# Patient Record
Sex: Male | Born: 1980
Health system: Southern US, Community
[De-identification: ages and names within clinical notes are randomized; demographics above are authoritative.]

## PROBLEM LIST (undated history)

## (undated) DIAGNOSIS — I10 Essential (primary) hypertension: Secondary | ICD-10-CM

---

## 2015-08-24 DIAGNOSIS — M791 Myalgia: Secondary | ICD-10-CM | POA: Diagnosis not present

## 2015-08-24 DIAGNOSIS — M9902 Segmental and somatic dysfunction of thoracic region: Secondary | ICD-10-CM | POA: Diagnosis not present

## 2015-08-24 DIAGNOSIS — M9903 Segmental and somatic dysfunction of lumbar region: Secondary | ICD-10-CM | POA: Diagnosis not present

## 2015-08-24 DIAGNOSIS — M9901 Segmental and somatic dysfunction of cervical region: Secondary | ICD-10-CM | POA: Diagnosis not present

## 2015-11-30 DIAGNOSIS — K080 Exfoliation of teeth due to systemic causes: Secondary | ICD-10-CM | POA: Diagnosis not present

## 2016-02-05 DIAGNOSIS — G47419 Narcolepsy without cataplexy: Secondary | ICD-10-CM | POA: Diagnosis not present

## 2016-05-09 DIAGNOSIS — M9902 Segmental and somatic dysfunction of thoracic region: Secondary | ICD-10-CM | POA: Diagnosis not present

## 2016-05-09 DIAGNOSIS — M9903 Segmental and somatic dysfunction of lumbar region: Secondary | ICD-10-CM | POA: Diagnosis not present

## 2016-05-09 DIAGNOSIS — M9905 Segmental and somatic dysfunction of pelvic region: Secondary | ICD-10-CM | POA: Diagnosis not present

## 2016-05-09 DIAGNOSIS — M9901 Segmental and somatic dysfunction of cervical region: Secondary | ICD-10-CM | POA: Diagnosis not present

## 2016-05-10 DIAGNOSIS — J069 Acute upper respiratory infection, unspecified: Secondary | ICD-10-CM | POA: Diagnosis not present

## 2016-05-19 DIAGNOSIS — Z0001 Encounter for general adult medical examination with abnormal findings: Secondary | ICD-10-CM | POA: Diagnosis not present

## 2016-05-20 DIAGNOSIS — Z0001 Encounter for general adult medical examination with abnormal findings: Secondary | ICD-10-CM | POA: Diagnosis not present

## 2016-07-08 DIAGNOSIS — J029 Acute pharyngitis, unspecified: Secondary | ICD-10-CM | POA: Diagnosis not present

## 2016-07-08 DIAGNOSIS — R05 Cough: Secondary | ICD-10-CM | POA: Diagnosis not present

## 2016-07-08 DIAGNOSIS — J209 Acute bronchitis, unspecified: Secondary | ICD-10-CM | POA: Diagnosis not present

## 2016-09-23 DIAGNOSIS — G47419 Narcolepsy without cataplexy: Secondary | ICD-10-CM | POA: Diagnosis not present

## 2016-11-21 DIAGNOSIS — M791 Myalgia: Secondary | ICD-10-CM | POA: Diagnosis not present

## 2016-11-21 DIAGNOSIS — M9903 Segmental and somatic dysfunction of lumbar region: Secondary | ICD-10-CM | POA: Diagnosis not present

## 2016-11-21 DIAGNOSIS — M9905 Segmental and somatic dysfunction of pelvic region: Secondary | ICD-10-CM | POA: Diagnosis not present

## 2016-11-21 DIAGNOSIS — M9902 Segmental and somatic dysfunction of thoracic region: Secondary | ICD-10-CM | POA: Diagnosis not present

## 2016-11-24 DIAGNOSIS — M9903 Segmental and somatic dysfunction of lumbar region: Secondary | ICD-10-CM | POA: Diagnosis not present

## 2016-11-24 DIAGNOSIS — M9902 Segmental and somatic dysfunction of thoracic region: Secondary | ICD-10-CM | POA: Diagnosis not present

## 2016-11-24 DIAGNOSIS — M791 Myalgia: Secondary | ICD-10-CM | POA: Diagnosis not present

## 2016-11-24 DIAGNOSIS — M9905 Segmental and somatic dysfunction of pelvic region: Secondary | ICD-10-CM | POA: Diagnosis not present

## 2016-11-28 DIAGNOSIS — M9905 Segmental and somatic dysfunction of pelvic region: Secondary | ICD-10-CM | POA: Diagnosis not present

## 2016-11-28 DIAGNOSIS — M9902 Segmental and somatic dysfunction of thoracic region: Secondary | ICD-10-CM | POA: Diagnosis not present

## 2016-11-28 DIAGNOSIS — M791 Myalgia: Secondary | ICD-10-CM | POA: Diagnosis not present

## 2016-11-28 DIAGNOSIS — M9903 Segmental and somatic dysfunction of lumbar region: Secondary | ICD-10-CM | POA: Diagnosis not present

## 2016-12-12 DIAGNOSIS — M9905 Segmental and somatic dysfunction of pelvic region: Secondary | ICD-10-CM | POA: Diagnosis not present

## 2016-12-12 DIAGNOSIS — M9903 Segmental and somatic dysfunction of lumbar region: Secondary | ICD-10-CM | POA: Diagnosis not present

## 2016-12-12 DIAGNOSIS — M791 Myalgia: Secondary | ICD-10-CM | POA: Diagnosis not present

## 2016-12-12 DIAGNOSIS — M9902 Segmental and somatic dysfunction of thoracic region: Secondary | ICD-10-CM | POA: Diagnosis not present

## 2017-01-06 DIAGNOSIS — M9902 Segmental and somatic dysfunction of thoracic region: Secondary | ICD-10-CM | POA: Diagnosis not present

## 2017-01-06 DIAGNOSIS — M9903 Segmental and somatic dysfunction of lumbar region: Secondary | ICD-10-CM | POA: Diagnosis not present

## 2017-01-06 DIAGNOSIS — M791 Myalgia: Secondary | ICD-10-CM | POA: Diagnosis not present

## 2017-01-06 DIAGNOSIS — M9905 Segmental and somatic dysfunction of pelvic region: Secondary | ICD-10-CM | POA: Diagnosis not present

## 2017-04-08 DIAGNOSIS — G47419 Narcolepsy without cataplexy: Secondary | ICD-10-CM | POA: Diagnosis not present

## 2017-06-01 DIAGNOSIS — J029 Acute pharyngitis, unspecified: Secondary | ICD-10-CM | POA: Diagnosis not present

## 2017-06-03 DIAGNOSIS — J029 Acute pharyngitis, unspecified: Secondary | ICD-10-CM | POA: Diagnosis not present

## 2017-06-03 DIAGNOSIS — J020 Streptococcal pharyngitis: Secondary | ICD-10-CM | POA: Diagnosis not present

## 2017-06-03 DIAGNOSIS — J0300 Acute streptococcal tonsillitis, unspecified: Secondary | ICD-10-CM | POA: Diagnosis not present

## 2017-07-03 ENCOUNTER — Ambulatory Visit
Admission: RE | Admit: 2017-07-03 | Discharge: 2017-07-03 | Disposition: A | Discharge status: Home / Self Care | Payer: Federal, State, Local not specified - PPO | Source: Ambulatory Visit | Attending: Internal Medicine | Admitting: Internal Medicine

## 2017-07-03 ENCOUNTER — Other Ambulatory Visit: Payer: Self-pay | Admitting: Internal Medicine

## 2017-07-03 DIAGNOSIS — R05 Cough: Secondary | ICD-10-CM | POA: Diagnosis not present

## 2017-07-03 DIAGNOSIS — R059 Cough, unspecified: Secondary | ICD-10-CM

## 2017-07-03 DIAGNOSIS — G47419 Narcolepsy without cataplexy: Secondary | ICD-10-CM | POA: Diagnosis not present

## 2017-07-21 DIAGNOSIS — M791 Myalgia, unspecified site: Secondary | ICD-10-CM | POA: Diagnosis not present

## 2017-07-21 DIAGNOSIS — M9902 Segmental and somatic dysfunction of thoracic region: Secondary | ICD-10-CM | POA: Diagnosis not present

## 2017-07-21 DIAGNOSIS — M9905 Segmental and somatic dysfunction of pelvic region: Secondary | ICD-10-CM | POA: Diagnosis not present

## 2017-07-21 DIAGNOSIS — M9903 Segmental and somatic dysfunction of lumbar region: Secondary | ICD-10-CM | POA: Diagnosis not present

## 2017-09-28 DIAGNOSIS — K080 Exfoliation of teeth due to systemic causes: Secondary | ICD-10-CM | POA: Diagnosis not present

## 2017-11-04 DIAGNOSIS — M791 Myalgia, unspecified site: Secondary | ICD-10-CM | POA: Diagnosis not present

## 2017-11-04 DIAGNOSIS — M9905 Segmental and somatic dysfunction of pelvic region: Secondary | ICD-10-CM | POA: Diagnosis not present

## 2017-11-04 DIAGNOSIS — M9903 Segmental and somatic dysfunction of lumbar region: Secondary | ICD-10-CM | POA: Diagnosis not present

## 2017-11-04 DIAGNOSIS — M9902 Segmental and somatic dysfunction of thoracic region: Secondary | ICD-10-CM | POA: Diagnosis not present

## 2017-11-12 DIAGNOSIS — G47419 Narcolepsy without cataplexy: Secondary | ICD-10-CM | POA: Diagnosis not present

## 2017-11-13 DIAGNOSIS — M791 Myalgia, unspecified site: Secondary | ICD-10-CM | POA: Diagnosis not present

## 2017-11-13 DIAGNOSIS — M9905 Segmental and somatic dysfunction of pelvic region: Secondary | ICD-10-CM | POA: Diagnosis not present

## 2017-11-13 DIAGNOSIS — M9903 Segmental and somatic dysfunction of lumbar region: Secondary | ICD-10-CM | POA: Diagnosis not present

## 2017-11-13 DIAGNOSIS — M9902 Segmental and somatic dysfunction of thoracic region: Secondary | ICD-10-CM | POA: Diagnosis not present

## 2017-11-28 DIAGNOSIS — K080 Exfoliation of teeth due to systemic causes: Secondary | ICD-10-CM | POA: Diagnosis not present

## 2018-03-03 DIAGNOSIS — M9902 Segmental and somatic dysfunction of thoracic region: Secondary | ICD-10-CM | POA: Diagnosis not present

## 2018-03-03 DIAGNOSIS — K080 Exfoliation of teeth due to systemic causes: Secondary | ICD-10-CM | POA: Diagnosis not present

## 2018-03-03 DIAGNOSIS — M9903 Segmental and somatic dysfunction of lumbar region: Secondary | ICD-10-CM | POA: Diagnosis not present

## 2018-03-03 DIAGNOSIS — M9905 Segmental and somatic dysfunction of pelvic region: Secondary | ICD-10-CM | POA: Diagnosis not present

## 2018-03-03 DIAGNOSIS — M791 Myalgia, unspecified site: Secondary | ICD-10-CM | POA: Diagnosis not present

## 2018-03-15 DIAGNOSIS — M791 Myalgia, unspecified site: Secondary | ICD-10-CM | POA: Diagnosis not present

## 2018-03-15 DIAGNOSIS — M9902 Segmental and somatic dysfunction of thoracic region: Secondary | ICD-10-CM | POA: Diagnosis not present

## 2018-03-15 DIAGNOSIS — M9903 Segmental and somatic dysfunction of lumbar region: Secondary | ICD-10-CM | POA: Diagnosis not present

## 2018-03-15 DIAGNOSIS — M9905 Segmental and somatic dysfunction of pelvic region: Secondary | ICD-10-CM | POA: Diagnosis not present

## 2018-03-23 DIAGNOSIS — M9903 Segmental and somatic dysfunction of lumbar region: Secondary | ICD-10-CM | POA: Diagnosis not present

## 2018-03-23 DIAGNOSIS — M791 Myalgia, unspecified site: Secondary | ICD-10-CM | POA: Diagnosis not present

## 2018-03-23 DIAGNOSIS — M9905 Segmental and somatic dysfunction of pelvic region: Secondary | ICD-10-CM | POA: Diagnosis not present

## 2018-03-23 DIAGNOSIS — M9902 Segmental and somatic dysfunction of thoracic region: Secondary | ICD-10-CM | POA: Diagnosis not present

## 2018-04-05 DIAGNOSIS — G47419 Narcolepsy without cataplexy: Secondary | ICD-10-CM | POA: Diagnosis not present

## 2018-04-17 DIAGNOSIS — F4323 Adjustment disorder with mixed anxiety and depressed mood: Secondary | ICD-10-CM | POA: Diagnosis not present

## 2018-04-24 DIAGNOSIS — Z202 Contact with and (suspected) exposure to infections with a predominantly sexual mode of transmission: Secondary | ICD-10-CM | POA: Diagnosis not present

## 2018-04-24 DIAGNOSIS — F4323 Adjustment disorder with mixed anxiety and depressed mood: Secondary | ICD-10-CM | POA: Diagnosis not present

## 2018-04-24 DIAGNOSIS — Z630 Problems in relationship with spouse or partner: Secondary | ICD-10-CM | POA: Diagnosis not present

## 2018-05-10 DIAGNOSIS — G47419 Narcolepsy without cataplexy: Secondary | ICD-10-CM | POA: Diagnosis not present

## 2018-05-12 DIAGNOSIS — F4323 Adjustment disorder with mixed anxiety and depressed mood: Secondary | ICD-10-CM | POA: Diagnosis not present

## 2018-05-21 DIAGNOSIS — F4323 Adjustment disorder with mixed anxiety and depressed mood: Secondary | ICD-10-CM | POA: Diagnosis not present

## 2018-05-31 ENCOUNTER — Other Ambulatory Visit (HOSPITAL_BASED_OUTPATIENT_CLINIC_OR_DEPARTMENT_OTHER): Payer: Self-pay

## 2018-05-31 DIAGNOSIS — G4710 Hypersomnia, unspecified: Secondary | ICD-10-CM

## 2018-05-31 DIAGNOSIS — G47429 Narcolepsy in conditions classified elsewhere without cataplexy: Secondary | ICD-10-CM

## 2018-06-03 DIAGNOSIS — F4323 Adjustment disorder with mixed anxiety and depressed mood: Secondary | ICD-10-CM | POA: Diagnosis not present

## 2018-06-04 DIAGNOSIS — M9905 Segmental and somatic dysfunction of pelvic region: Secondary | ICD-10-CM | POA: Diagnosis not present

## 2018-06-04 DIAGNOSIS — M9902 Segmental and somatic dysfunction of thoracic region: Secondary | ICD-10-CM | POA: Diagnosis not present

## 2018-06-04 DIAGNOSIS — M9903 Segmental and somatic dysfunction of lumbar region: Secondary | ICD-10-CM | POA: Diagnosis not present

## 2018-06-04 DIAGNOSIS — M791 Myalgia, unspecified site: Secondary | ICD-10-CM | POA: Diagnosis not present

## 2018-06-12 DIAGNOSIS — F4323 Adjustment disorder with mixed anxiety and depressed mood: Secondary | ICD-10-CM | POA: Diagnosis not present

## 2018-06-18 DIAGNOSIS — F4323 Adjustment disorder with mixed anxiety and depressed mood: Secondary | ICD-10-CM | POA: Diagnosis not present

## 2018-06-18 DIAGNOSIS — Z630 Problems in relationship with spouse or partner: Secondary | ICD-10-CM | POA: Diagnosis not present

## 2018-06-21 ENCOUNTER — Ambulatory Visit (HOSPITAL_BASED_OUTPATIENT_CLINIC_OR_DEPARTMENT_OTHER): Payer: Federal, State, Local not specified - PPO | Attending: Internal Medicine | Admitting: Internal Medicine

## 2018-06-21 DIAGNOSIS — G47429 Narcolepsy in conditions classified elsewhere without cataplexy: Secondary | ICD-10-CM | POA: Diagnosis not present

## 2018-06-21 DIAGNOSIS — G4710 Hypersomnia, unspecified: Secondary | ICD-10-CM | POA: Insufficient documentation

## 2018-06-21 DIAGNOSIS — G47419 Narcolepsy without cataplexy: Secondary | ICD-10-CM | POA: Diagnosis not present

## 2018-06-21 NOTE — Procedures (Signed)
    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 Found asleep in bathroom stall. Medical examiner for [employer] requiring MWT to assure that patient does not have pathologic sleepiness. Patient slept at home the night before the study and took his usual narcolepsy medications at 0600 and 1300  EPWORTH SLEEPINESS SCORE:   HEIGHT: 5\' 10"  (177.8 cm)  WEIGHT: 210 lb (95.3 kg)    Body mass index is 30.13 kg/m.  NECK SIZE: 16.5 in.  MEDICATIONS: Methylphenidate 20 mg at 0600 and 1300 on day of testing.   NAP 1: No sleep. Session terminated after 40 minutes  NAP 2: No sleep. Session terminated after 40 minutes  NAP 3: Sleep onset (3 consecutive epochs of N1 sleep) at 12 minutes  NAP 4: No sleep. Session terminated after 40 minutes  MEAN SLEEP LATENCY: 33 minutes  NUMBER OF REM EPISODES:  none  IMPRESSION/ RECOMMENDATION:  This test is consistent with the findings of NO PATHOLOGICAL SLEEPINESS. The patient should be considered safe for driving and for carrying a service weapon.    Deretha Emory Sleep specialist, American Board of Internal Medicine  ELECTRONICALLY SIGNED ON:  06/21/2018, 8:39 PM [facility] PH: [phone]   FX: [phone] ACCREDITED BY THE AMERICAN ACADEMY OF SLEEP MEDICINE

## 2018-06-28 DIAGNOSIS — F4323 Adjustment disorder with mixed anxiety and depressed mood: Secondary | ICD-10-CM | POA: Diagnosis not present

## 2018-06-28 DIAGNOSIS — Z630 Problems in relationship with spouse or partner: Secondary | ICD-10-CM | POA: Diagnosis not present

## 2018-07-03 DIAGNOSIS — F4322 Adjustment disorder with anxiety: Secondary | ICD-10-CM | POA: Diagnosis not present

## 2018-07-05 DIAGNOSIS — F4323 Adjustment disorder with mixed anxiety and depressed mood: Secondary | ICD-10-CM | POA: Diagnosis not present

## 2018-07-10 DIAGNOSIS — F4322 Adjustment disorder with anxiety: Secondary | ICD-10-CM | POA: Diagnosis not present

## 2018-07-12 DIAGNOSIS — Z630 Problems in relationship with spouse or partner: Secondary | ICD-10-CM | POA: Diagnosis not present

## 2018-07-12 DIAGNOSIS — F4323 Adjustment disorder with mixed anxiety and depressed mood: Secondary | ICD-10-CM | POA: Diagnosis not present

## 2018-07-15 DIAGNOSIS — Z630 Problems in relationship with spouse or partner: Secondary | ICD-10-CM | POA: Diagnosis not present

## 2018-07-15 DIAGNOSIS — F4323 Adjustment disorder with mixed anxiety and depressed mood: Secondary | ICD-10-CM | POA: Diagnosis not present

## 2018-07-22 DIAGNOSIS — F4323 Adjustment disorder with mixed anxiety and depressed mood: Secondary | ICD-10-CM | POA: Diagnosis not present

## 2018-07-22 DIAGNOSIS — Z630 Problems in relationship with spouse or partner: Secondary | ICD-10-CM | POA: Diagnosis not present

## 2018-07-24 DIAGNOSIS — F4322 Adjustment disorder with anxiety: Secondary | ICD-10-CM | POA: Diagnosis not present

## 2018-07-27 DIAGNOSIS — Z630 Problems in relationship with spouse or partner: Secondary | ICD-10-CM | POA: Diagnosis not present

## 2018-07-27 DIAGNOSIS — F4323 Adjustment disorder with mixed anxiety and depressed mood: Secondary | ICD-10-CM | POA: Diagnosis not present

## 2018-08-05 DIAGNOSIS — F4323 Adjustment disorder with mixed anxiety and depressed mood: Secondary | ICD-10-CM | POA: Diagnosis not present

## 2018-08-05 DIAGNOSIS — Z630 Problems in relationship with spouse or partner: Secondary | ICD-10-CM | POA: Diagnosis not present

## 2018-08-12 DIAGNOSIS — F4323 Adjustment disorder with mixed anxiety and depressed mood: Secondary | ICD-10-CM | POA: Diagnosis not present

## 2018-08-12 DIAGNOSIS — Z630 Problems in relationship with spouse or partner: Secondary | ICD-10-CM | POA: Diagnosis not present

## 2018-08-16 DIAGNOSIS — J029 Acute pharyngitis, unspecified: Secondary | ICD-10-CM | POA: Diagnosis not present

## 2018-08-23 DIAGNOSIS — F4323 Adjustment disorder with mixed anxiety and depressed mood: Secondary | ICD-10-CM | POA: Diagnosis not present

## 2018-08-23 DIAGNOSIS — Z630 Problems in relationship with spouse or partner: Secondary | ICD-10-CM | POA: Diagnosis not present

## 2018-08-26 DIAGNOSIS — Z630 Problems in relationship with spouse or partner: Secondary | ICD-10-CM | POA: Diagnosis not present

## 2018-08-26 DIAGNOSIS — F4323 Adjustment disorder with mixed anxiety and depressed mood: Secondary | ICD-10-CM | POA: Diagnosis not present

## 2018-08-30 DIAGNOSIS — Z630 Problems in relationship with spouse or partner: Secondary | ICD-10-CM | POA: Diagnosis not present

## 2018-08-30 DIAGNOSIS — F4323 Adjustment disorder with mixed anxiety and depressed mood: Secondary | ICD-10-CM | POA: Diagnosis not present

## 2018-09-10 DIAGNOSIS — Z630 Problems in relationship with spouse or partner: Secondary | ICD-10-CM | POA: Diagnosis not present

## 2018-09-10 DIAGNOSIS — F4323 Adjustment disorder with mixed anxiety and depressed mood: Secondary | ICD-10-CM | POA: Diagnosis not present

## 2018-09-16 DIAGNOSIS — Z630 Problems in relationship with spouse or partner: Secondary | ICD-10-CM | POA: Diagnosis not present

## 2018-09-16 DIAGNOSIS — F4323 Adjustment disorder with mixed anxiety and depressed mood: Secondary | ICD-10-CM | POA: Diagnosis not present

## 2018-09-23 DIAGNOSIS — Z630 Problems in relationship with spouse or partner: Secondary | ICD-10-CM | POA: Diagnosis not present

## 2018-09-23 DIAGNOSIS — F4323 Adjustment disorder with mixed anxiety and depressed mood: Secondary | ICD-10-CM | POA: Diagnosis not present

## 2018-09-30 DIAGNOSIS — F4323 Adjustment disorder with mixed anxiety and depressed mood: Secondary | ICD-10-CM | POA: Diagnosis not present

## 2018-09-30 DIAGNOSIS — Z630 Problems in relationship with spouse or partner: Secondary | ICD-10-CM | POA: Diagnosis not present

## 2018-10-01 DIAGNOSIS — M9902 Segmental and somatic dysfunction of thoracic region: Secondary | ICD-10-CM | POA: Diagnosis not present

## 2018-10-01 DIAGNOSIS — M791 Myalgia, unspecified site: Secondary | ICD-10-CM | POA: Diagnosis not present

## 2018-10-01 DIAGNOSIS — M9903 Segmental and somatic dysfunction of lumbar region: Secondary | ICD-10-CM | POA: Diagnosis not present

## 2018-10-01 DIAGNOSIS — M9905 Segmental and somatic dysfunction of pelvic region: Secondary | ICD-10-CM | POA: Diagnosis not present

## 2018-10-06 DIAGNOSIS — M9905 Segmental and somatic dysfunction of pelvic region: Secondary | ICD-10-CM | POA: Diagnosis not present

## 2018-10-06 DIAGNOSIS — M9902 Segmental and somatic dysfunction of thoracic region: Secondary | ICD-10-CM | POA: Diagnosis not present

## 2018-10-06 DIAGNOSIS — G47419 Narcolepsy without cataplexy: Secondary | ICD-10-CM | POA: Diagnosis not present

## 2018-10-06 DIAGNOSIS — M9903 Segmental and somatic dysfunction of lumbar region: Secondary | ICD-10-CM | POA: Diagnosis not present

## 2018-10-06 DIAGNOSIS — M791 Myalgia, unspecified site: Secondary | ICD-10-CM | POA: Diagnosis not present

## 2018-10-07 DIAGNOSIS — Z630 Problems in relationship with spouse or partner: Secondary | ICD-10-CM | POA: Diagnosis not present

## 2018-10-07 DIAGNOSIS — F4323 Adjustment disorder with mixed anxiety and depressed mood: Secondary | ICD-10-CM | POA: Diagnosis not present

## 2018-10-14 DIAGNOSIS — F4323 Adjustment disorder with mixed anxiety and depressed mood: Secondary | ICD-10-CM | POA: Diagnosis not present

## 2018-10-14 DIAGNOSIS — Z630 Problems in relationship with spouse or partner: Secondary | ICD-10-CM | POA: Diagnosis not present

## 2018-10-28 DIAGNOSIS — F4323 Adjustment disorder with mixed anxiety and depressed mood: Secondary | ICD-10-CM | POA: Diagnosis not present

## 2018-10-28 DIAGNOSIS — Z630 Problems in relationship with spouse or partner: Secondary | ICD-10-CM | POA: Diagnosis not present

## 2018-10-30 DIAGNOSIS — F4322 Adjustment disorder with anxiety: Secondary | ICD-10-CM | POA: Diagnosis not present

## 2018-11-11 DIAGNOSIS — F4323 Adjustment disorder with mixed anxiety and depressed mood: Secondary | ICD-10-CM | POA: Diagnosis not present

## 2018-11-11 DIAGNOSIS — Z630 Problems in relationship with spouse or partner: Secondary | ICD-10-CM | POA: Diagnosis not present

## 2018-12-17 DIAGNOSIS — F4323 Adjustment disorder with mixed anxiety and depressed mood: Secondary | ICD-10-CM | POA: Diagnosis not present

## 2018-12-17 DIAGNOSIS — Z630 Problems in relationship with spouse or partner: Secondary | ICD-10-CM | POA: Diagnosis not present

## 2019-01-13 DIAGNOSIS — F4323 Adjustment disorder with mixed anxiety and depressed mood: Secondary | ICD-10-CM | POA: Diagnosis not present

## 2019-01-13 DIAGNOSIS — Z630 Problems in relationship with spouse or partner: Secondary | ICD-10-CM | POA: Diagnosis not present

## 2019-01-20 DIAGNOSIS — Z630 Problems in relationship with spouse or partner: Secondary | ICD-10-CM | POA: Diagnosis not present

## 2019-01-20 DIAGNOSIS — F4323 Adjustment disorder with mixed anxiety and depressed mood: Secondary | ICD-10-CM | POA: Diagnosis not present

## 2019-01-24 DIAGNOSIS — Z23 Encounter for immunization: Secondary | ICD-10-CM | POA: Diagnosis not present

## 2019-01-24 DIAGNOSIS — J029 Acute pharyngitis, unspecified: Secondary | ICD-10-CM | POA: Diagnosis not present

## 2019-01-24 DIAGNOSIS — J018 Other acute sinusitis: Secondary | ICD-10-CM | POA: Diagnosis not present

## 2019-01-27 DIAGNOSIS — Z630 Problems in relationship with spouse or partner: Secondary | ICD-10-CM | POA: Diagnosis not present

## 2019-01-27 DIAGNOSIS — F4323 Adjustment disorder with mixed anxiety and depressed mood: Secondary | ICD-10-CM | POA: Diagnosis not present

## 2019-01-31 DIAGNOSIS — F4322 Adjustment disorder with anxiety: Secondary | ICD-10-CM | POA: Diagnosis not present

## 2019-01-31 DIAGNOSIS — G47419 Narcolepsy without cataplexy: Secondary | ICD-10-CM | POA: Diagnosis not present

## 2019-02-10 DIAGNOSIS — Z630 Problems in relationship with spouse or partner: Secondary | ICD-10-CM | POA: Diagnosis not present

## 2019-02-10 DIAGNOSIS — F4323 Adjustment disorder with mixed anxiety and depressed mood: Secondary | ICD-10-CM | POA: Diagnosis not present

## 2019-02-22 DIAGNOSIS — G47419 Narcolepsy without cataplexy: Secondary | ICD-10-CM | POA: Diagnosis not present

## 2019-02-24 DIAGNOSIS — Z630 Problems in relationship with spouse or partner: Secondary | ICD-10-CM | POA: Diagnosis not present

## 2019-02-24 DIAGNOSIS — F4323 Adjustment disorder with mixed anxiety and depressed mood: Secondary | ICD-10-CM | POA: Diagnosis not present

## 2019-03-03 DIAGNOSIS — Z630 Problems in relationship with spouse or partner: Secondary | ICD-10-CM | POA: Diagnosis not present

## 2019-03-03 DIAGNOSIS — F4323 Adjustment disorder with mixed anxiety and depressed mood: Secondary | ICD-10-CM | POA: Diagnosis not present

## 2019-03-10 DIAGNOSIS — F4323 Adjustment disorder with mixed anxiety and depressed mood: Secondary | ICD-10-CM | POA: Diagnosis not present

## 2019-03-10 DIAGNOSIS — Z630 Problems in relationship with spouse or partner: Secondary | ICD-10-CM | POA: Diagnosis not present

## 2019-03-28 DIAGNOSIS — Z630 Problems in relationship with spouse or partner: Secondary | ICD-10-CM | POA: Diagnosis not present

## 2019-03-28 DIAGNOSIS — F4323 Adjustment disorder with mixed anxiety and depressed mood: Secondary | ICD-10-CM | POA: Diagnosis not present

## 2019-04-13 DIAGNOSIS — K080 Exfoliation of teeth due to systemic causes: Secondary | ICD-10-CM | POA: Diagnosis not present

## 2019-04-20 DIAGNOSIS — F411 Generalized anxiety disorder: Secondary | ICD-10-CM | POA: Diagnosis not present

## 2019-04-25 DIAGNOSIS — G47419 Narcolepsy without cataplexy: Secondary | ICD-10-CM | POA: Diagnosis not present

## 2019-04-26 DIAGNOSIS — Z630 Problems in relationship with spouse or partner: Secondary | ICD-10-CM | POA: Diagnosis not present

## 2019-04-26 DIAGNOSIS — F4323 Adjustment disorder with mixed anxiety and depressed mood: Secondary | ICD-10-CM | POA: Diagnosis not present

## 2019-05-04 DIAGNOSIS — Z630 Problems in relationship with spouse or partner: Secondary | ICD-10-CM | POA: Diagnosis not present

## 2019-05-04 DIAGNOSIS — F4323 Adjustment disorder with mixed anxiety and depressed mood: Secondary | ICD-10-CM | POA: Diagnosis not present

## 2019-05-09 DIAGNOSIS — Z03818 Encounter for observation for suspected exposure to other biological agents ruled out: Secondary | ICD-10-CM | POA: Diagnosis not present

## 2019-05-09 IMAGING — DX DG CHEST 2V
3 series · 3 of 3 positions shown · non-contrast
Comparison: None.

CLINICAL DATA: Chronic cough nonsmoker

EXAM:
CHEST  2 VIEW

[dg chest 2 view (1 of 3)]
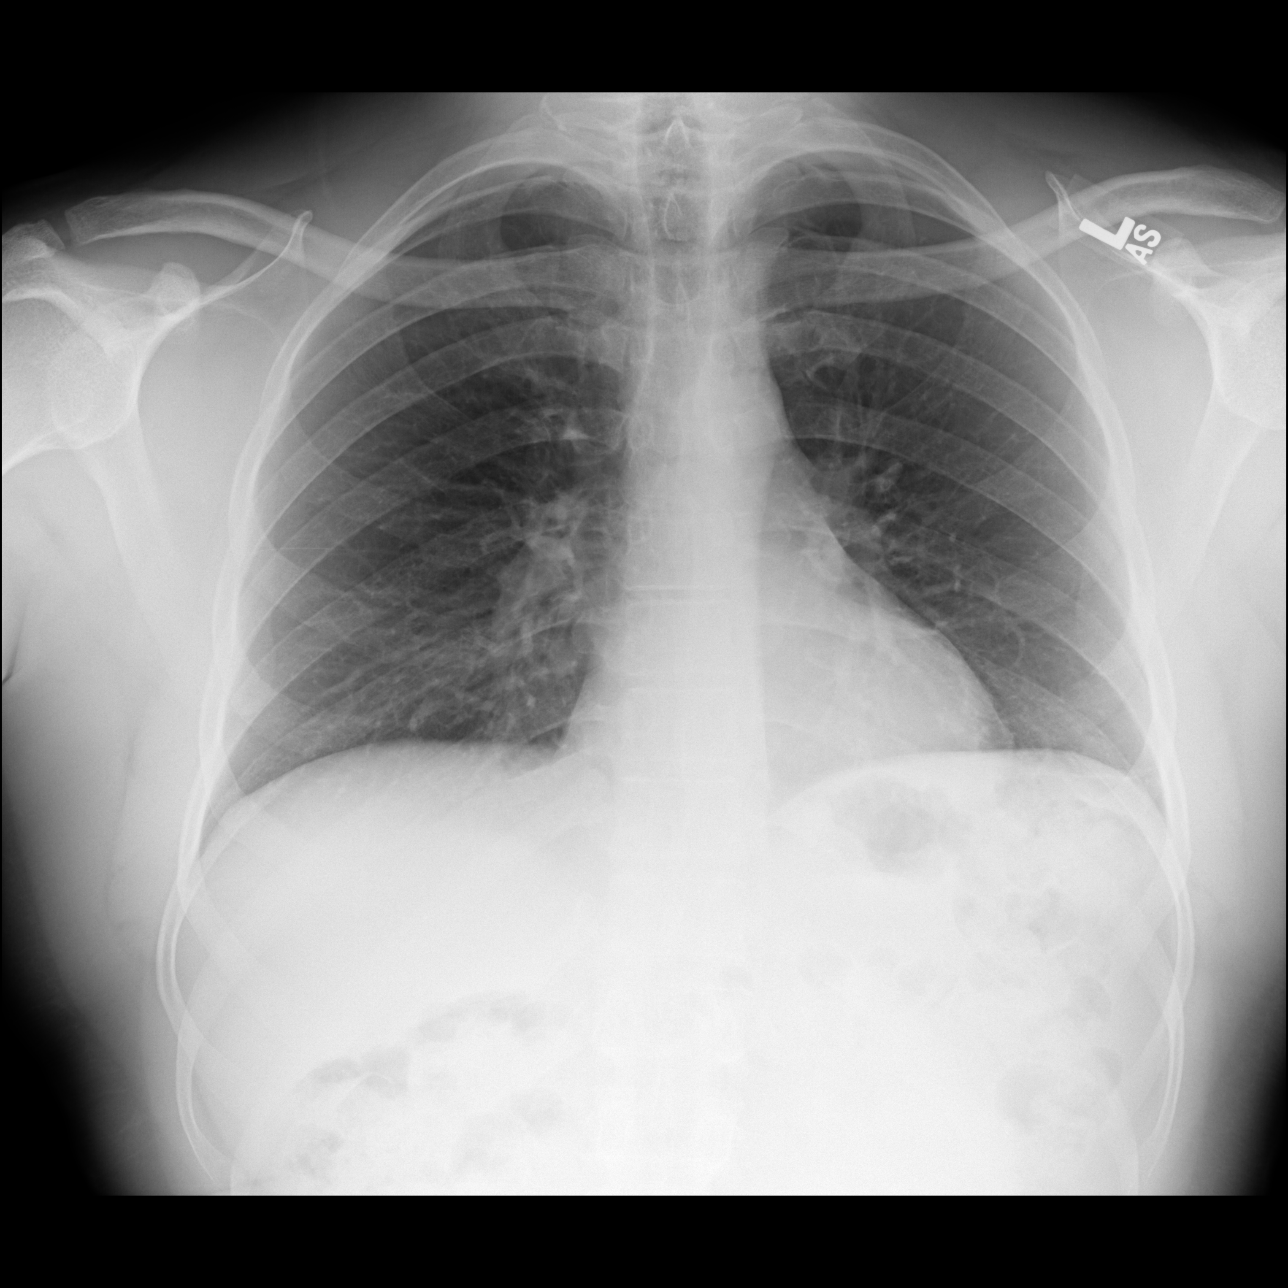

[dg chest 2 view (2 of 3)]
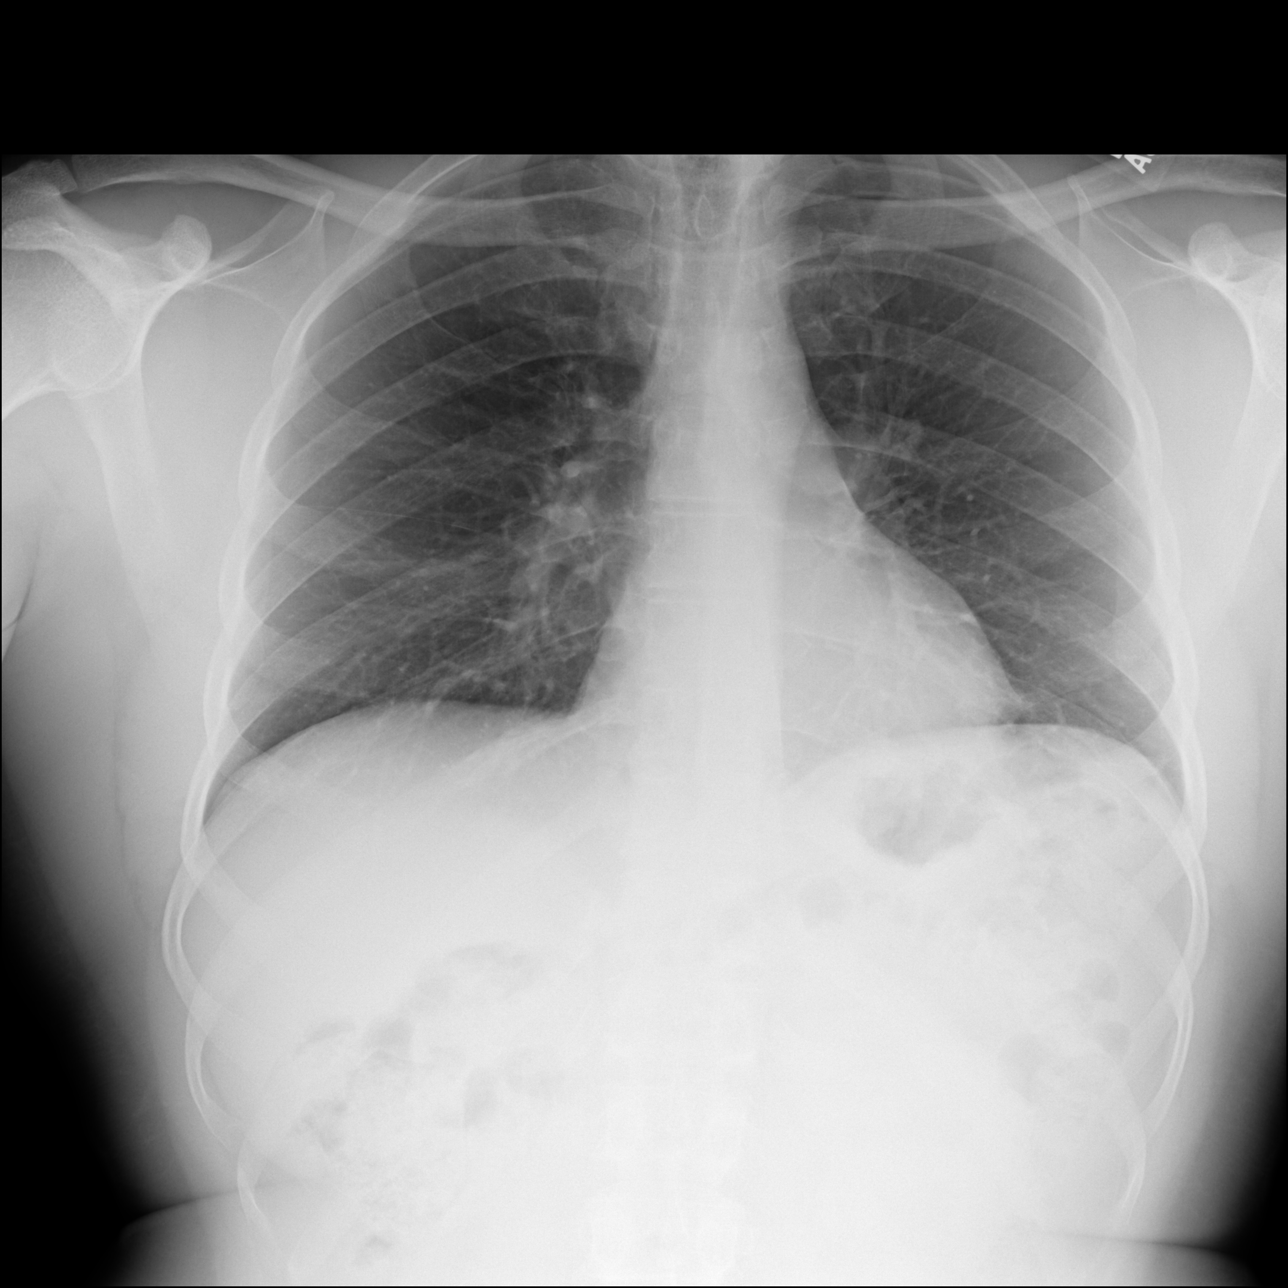

[dg chest 2 view (3 of 3)]
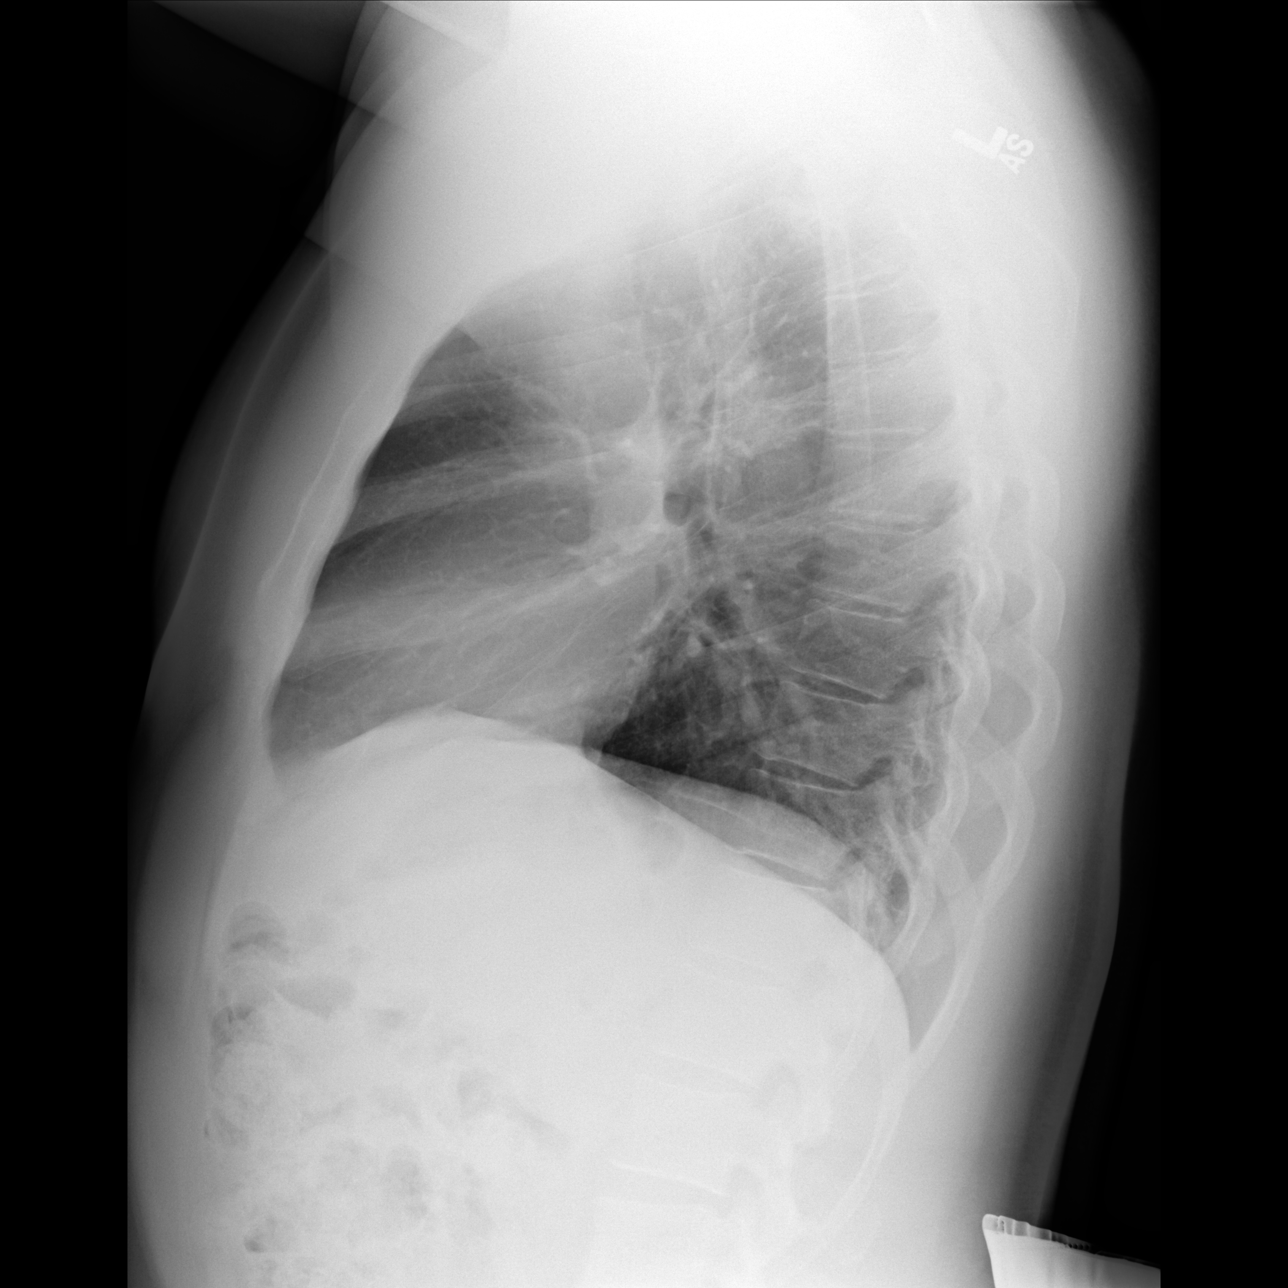

[3 of 3 positions shown; findings below may reference images not displayed]

FINDINGS: The heart size and mediastinal contours are within normal limits.
Both lungs are clear. The visualized skeletal structures are
unremarkable.
IMPRESSION: No active cardiopulmonary disease.

## 2019-05-11 DIAGNOSIS — R5383 Other fatigue: Secondary | ICD-10-CM | POA: Diagnosis not present

## 2019-05-11 DIAGNOSIS — J029 Acute pharyngitis, unspecified: Secondary | ICD-10-CM | POA: Diagnosis not present

## 2019-05-12 DIAGNOSIS — F4323 Adjustment disorder with mixed anxiety and depressed mood: Secondary | ICD-10-CM | POA: Diagnosis not present

## 2019-05-12 DIAGNOSIS — Z630 Problems in relationship with spouse or partner: Secondary | ICD-10-CM | POA: Diagnosis not present

## 2019-05-19 DIAGNOSIS — Z630 Problems in relationship with spouse or partner: Secondary | ICD-10-CM | POA: Diagnosis not present

## 2019-05-19 DIAGNOSIS — F4323 Adjustment disorder with mixed anxiety and depressed mood: Secondary | ICD-10-CM | POA: Diagnosis not present

## 2019-05-20 DIAGNOSIS — F900 Attention-deficit hyperactivity disorder, predominantly inattentive type: Secondary | ICD-10-CM | POA: Diagnosis not present

## 2019-05-26 DIAGNOSIS — F4323 Adjustment disorder with mixed anxiety and depressed mood: Secondary | ICD-10-CM | POA: Diagnosis not present

## 2019-05-26 DIAGNOSIS — Z630 Problems in relationship with spouse or partner: Secondary | ICD-10-CM | POA: Diagnosis not present

## 2019-06-08 DIAGNOSIS — R21 Rash and other nonspecific skin eruption: Secondary | ICD-10-CM | POA: Diagnosis not present

## 2019-06-08 DIAGNOSIS — J3089 Other allergic rhinitis: Secondary | ICD-10-CM | POA: Diagnosis not present

## 2019-06-08 DIAGNOSIS — J301 Allergic rhinitis due to pollen: Secondary | ICD-10-CM | POA: Diagnosis not present

## 2019-06-09 DIAGNOSIS — F4323 Adjustment disorder with mixed anxiety and depressed mood: Secondary | ICD-10-CM | POA: Diagnosis not present

## 2019-06-09 DIAGNOSIS — Z630 Problems in relationship with spouse or partner: Secondary | ICD-10-CM | POA: Diagnosis not present

## 2019-06-10 DIAGNOSIS — Z03818 Encounter for observation for suspected exposure to other biological agents ruled out: Secondary | ICD-10-CM | POA: Diagnosis not present

## 2019-06-20 DIAGNOSIS — Z03818 Encounter for observation for suspected exposure to other biological agents ruled out: Secondary | ICD-10-CM | POA: Diagnosis not present

## 2019-06-23 DIAGNOSIS — F4323 Adjustment disorder with mixed anxiety and depressed mood: Secondary | ICD-10-CM | POA: Diagnosis not present

## 2019-06-23 DIAGNOSIS — Z630 Problems in relationship with spouse or partner: Secondary | ICD-10-CM | POA: Diagnosis not present

## 2019-07-07 DIAGNOSIS — Z630 Problems in relationship with spouse or partner: Secondary | ICD-10-CM | POA: Diagnosis not present

## 2019-07-07 DIAGNOSIS — F4323 Adjustment disorder with mixed anxiety and depressed mood: Secondary | ICD-10-CM | POA: Diagnosis not present

## 2019-08-04 DIAGNOSIS — F4323 Adjustment disorder with mixed anxiety and depressed mood: Secondary | ICD-10-CM | POA: Diagnosis not present

## 2019-08-04 DIAGNOSIS — Z630 Problems in relationship with spouse or partner: Secondary | ICD-10-CM | POA: Diagnosis not present

## 2019-08-10 DIAGNOSIS — F4322 Adjustment disorder with anxiety: Secondary | ICD-10-CM | POA: Diagnosis not present

## 2019-08-12 DIAGNOSIS — M9905 Segmental and somatic dysfunction of pelvic region: Secondary | ICD-10-CM | POA: Diagnosis not present

## 2019-08-12 DIAGNOSIS — M791 Myalgia, unspecified site: Secondary | ICD-10-CM | POA: Diagnosis not present

## 2019-08-12 DIAGNOSIS — M9903 Segmental and somatic dysfunction of lumbar region: Secondary | ICD-10-CM | POA: Diagnosis not present

## 2019-08-12 DIAGNOSIS — M9902 Segmental and somatic dysfunction of thoracic region: Secondary | ICD-10-CM | POA: Diagnosis not present

## 2019-08-18 DIAGNOSIS — Z630 Problems in relationship with spouse or partner: Secondary | ICD-10-CM | POA: Diagnosis not present

## 2019-08-18 DIAGNOSIS — F4323 Adjustment disorder with mixed anxiety and depressed mood: Secondary | ICD-10-CM | POA: Diagnosis not present

## 2019-08-19 DIAGNOSIS — M791 Myalgia, unspecified site: Secondary | ICD-10-CM | POA: Diagnosis not present

## 2019-08-19 DIAGNOSIS — M9905 Segmental and somatic dysfunction of pelvic region: Secondary | ICD-10-CM | POA: Diagnosis not present

## 2019-08-19 DIAGNOSIS — M9903 Segmental and somatic dysfunction of lumbar region: Secondary | ICD-10-CM | POA: Diagnosis not present

## 2019-08-19 DIAGNOSIS — M9902 Segmental and somatic dysfunction of thoracic region: Secondary | ICD-10-CM | POA: Diagnosis not present

## 2019-08-24 DIAGNOSIS — M9902 Segmental and somatic dysfunction of thoracic region: Secondary | ICD-10-CM | POA: Diagnosis not present

## 2019-08-24 DIAGNOSIS — M9903 Segmental and somatic dysfunction of lumbar region: Secondary | ICD-10-CM | POA: Diagnosis not present

## 2019-08-24 DIAGNOSIS — M791 Myalgia, unspecified site: Secondary | ICD-10-CM | POA: Diagnosis not present

## 2019-08-24 DIAGNOSIS — M9905 Segmental and somatic dysfunction of pelvic region: Secondary | ICD-10-CM | POA: Diagnosis not present

## 2019-09-02 DIAGNOSIS — M9903 Segmental and somatic dysfunction of lumbar region: Secondary | ICD-10-CM | POA: Diagnosis not present

## 2019-09-02 DIAGNOSIS — M791 Myalgia, unspecified site: Secondary | ICD-10-CM | POA: Diagnosis not present

## 2019-09-02 DIAGNOSIS — M9902 Segmental and somatic dysfunction of thoracic region: Secondary | ICD-10-CM | POA: Diagnosis not present

## 2019-09-02 DIAGNOSIS — M9905 Segmental and somatic dysfunction of pelvic region: Secondary | ICD-10-CM | POA: Diagnosis not present

## 2019-09-09 DIAGNOSIS — M791 Myalgia, unspecified site: Secondary | ICD-10-CM | POA: Diagnosis not present

## 2019-09-09 DIAGNOSIS — M9903 Segmental and somatic dysfunction of lumbar region: Secondary | ICD-10-CM | POA: Diagnosis not present

## 2019-09-09 DIAGNOSIS — M9902 Segmental and somatic dysfunction of thoracic region: Secondary | ICD-10-CM | POA: Diagnosis not present

## 2019-09-09 DIAGNOSIS — M9905 Segmental and somatic dysfunction of pelvic region: Secondary | ICD-10-CM | POA: Diagnosis not present

## 2019-09-15 DIAGNOSIS — Z630 Problems in relationship with spouse or partner: Secondary | ICD-10-CM | POA: Diagnosis not present

## 2019-09-15 DIAGNOSIS — F4323 Adjustment disorder with mixed anxiety and depressed mood: Secondary | ICD-10-CM | POA: Diagnosis not present

## 2019-09-16 DIAGNOSIS — M791 Myalgia, unspecified site: Secondary | ICD-10-CM | POA: Diagnosis not present

## 2019-09-16 DIAGNOSIS — M9905 Segmental and somatic dysfunction of pelvic region: Secondary | ICD-10-CM | POA: Diagnosis not present

## 2019-09-16 DIAGNOSIS — M9903 Segmental and somatic dysfunction of lumbar region: Secondary | ICD-10-CM | POA: Diagnosis not present

## 2019-09-16 DIAGNOSIS — M9902 Segmental and somatic dysfunction of thoracic region: Secondary | ICD-10-CM | POA: Diagnosis not present

## 2019-09-23 DIAGNOSIS — M9902 Segmental and somatic dysfunction of thoracic region: Secondary | ICD-10-CM | POA: Diagnosis not present

## 2019-09-23 DIAGNOSIS — M9905 Segmental and somatic dysfunction of pelvic region: Secondary | ICD-10-CM | POA: Diagnosis not present

## 2019-09-23 DIAGNOSIS — M791 Myalgia, unspecified site: Secondary | ICD-10-CM | POA: Diagnosis not present

## 2019-09-23 DIAGNOSIS — M9903 Segmental and somatic dysfunction of lumbar region: Secondary | ICD-10-CM | POA: Diagnosis not present

## 2019-10-13 DIAGNOSIS — K080 Exfoliation of teeth due to systemic causes: Secondary | ICD-10-CM | POA: Diagnosis not present

## 2019-10-19 DIAGNOSIS — G47419 Narcolepsy without cataplexy: Secondary | ICD-10-CM | POA: Diagnosis not present

## 2019-12-02 DIAGNOSIS — Z0000 Encounter for general adult medical examination without abnormal findings: Secondary | ICD-10-CM | POA: Diagnosis not present

## 2019-12-02 DIAGNOSIS — Z13220 Encounter for screening for lipoid disorders: Secondary | ICD-10-CM | POA: Diagnosis not present

## 2019-12-02 DIAGNOSIS — Z113 Encounter for screening for infections with a predominantly sexual mode of transmission: Secondary | ICD-10-CM | POA: Diagnosis not present

## 2019-12-08 DIAGNOSIS — Z630 Problems in relationship with spouse or partner: Secondary | ICD-10-CM | POA: Diagnosis not present

## 2019-12-08 DIAGNOSIS — F4323 Adjustment disorder with mixed anxiety and depressed mood: Secondary | ICD-10-CM | POA: Diagnosis not present

## 2019-12-28 DIAGNOSIS — G47419 Narcolepsy without cataplexy: Secondary | ICD-10-CM | POA: Diagnosis not present

## 2019-12-30 DIAGNOSIS — M791 Myalgia, unspecified site: Secondary | ICD-10-CM | POA: Diagnosis not present

## 2019-12-30 DIAGNOSIS — M9903 Segmental and somatic dysfunction of lumbar region: Secondary | ICD-10-CM | POA: Diagnosis not present

## 2019-12-30 DIAGNOSIS — M9905 Segmental and somatic dysfunction of pelvic region: Secondary | ICD-10-CM | POA: Diagnosis not present

## 2019-12-30 DIAGNOSIS — M9902 Segmental and somatic dysfunction of thoracic region: Secondary | ICD-10-CM | POA: Diagnosis not present

## 2020-01-20 DIAGNOSIS — M9902 Segmental and somatic dysfunction of thoracic region: Secondary | ICD-10-CM | POA: Diagnosis not present

## 2020-01-20 DIAGNOSIS — M9903 Segmental and somatic dysfunction of lumbar region: Secondary | ICD-10-CM | POA: Diagnosis not present

## 2020-01-20 DIAGNOSIS — M791 Myalgia, unspecified site: Secondary | ICD-10-CM | POA: Diagnosis not present

## 2020-01-20 DIAGNOSIS — M9905 Segmental and somatic dysfunction of pelvic region: Secondary | ICD-10-CM | POA: Diagnosis not present

## 2020-01-23 DIAGNOSIS — M791 Myalgia, unspecified site: Secondary | ICD-10-CM | POA: Diagnosis not present

## 2020-01-23 DIAGNOSIS — M9905 Segmental and somatic dysfunction of pelvic region: Secondary | ICD-10-CM | POA: Diagnosis not present

## 2020-01-23 DIAGNOSIS — M9903 Segmental and somatic dysfunction of lumbar region: Secondary | ICD-10-CM | POA: Diagnosis not present

## 2020-01-23 DIAGNOSIS — M9902 Segmental and somatic dysfunction of thoracic region: Secondary | ICD-10-CM | POA: Diagnosis not present

## 2020-01-26 DIAGNOSIS — F4323 Adjustment disorder with mixed anxiety and depressed mood: Secondary | ICD-10-CM | POA: Diagnosis not present

## 2020-01-26 DIAGNOSIS — Z630 Problems in relationship with spouse or partner: Secondary | ICD-10-CM | POA: Diagnosis not present

## 2020-01-27 DIAGNOSIS — M9902 Segmental and somatic dysfunction of thoracic region: Secondary | ICD-10-CM | POA: Diagnosis not present

## 2020-01-27 DIAGNOSIS — M791 Myalgia, unspecified site: Secondary | ICD-10-CM | POA: Diagnosis not present

## 2020-01-27 DIAGNOSIS — M9905 Segmental and somatic dysfunction of pelvic region: Secondary | ICD-10-CM | POA: Diagnosis not present

## 2020-01-27 DIAGNOSIS — M9903 Segmental and somatic dysfunction of lumbar region: Secondary | ICD-10-CM | POA: Diagnosis not present

## 2020-02-03 DIAGNOSIS — Z20822 Contact with and (suspected) exposure to covid-19: Secondary | ICD-10-CM | POA: Diagnosis not present

## 2020-02-03 DIAGNOSIS — J029 Acute pharyngitis, unspecified: Secondary | ICD-10-CM | POA: Diagnosis not present

## 2020-02-03 DIAGNOSIS — Z03818 Encounter for observation for suspected exposure to other biological agents ruled out: Secondary | ICD-10-CM | POA: Diagnosis not present

## 2020-02-03 DIAGNOSIS — R6883 Chills (without fever): Secondary | ICD-10-CM | POA: Diagnosis not present

## 2020-02-06 DIAGNOSIS — M791 Myalgia, unspecified site: Secondary | ICD-10-CM | POA: Diagnosis not present

## 2020-02-06 DIAGNOSIS — M9905 Segmental and somatic dysfunction of pelvic region: Secondary | ICD-10-CM | POA: Diagnosis not present

## 2020-02-06 DIAGNOSIS — M9903 Segmental and somatic dysfunction of lumbar region: Secondary | ICD-10-CM | POA: Diagnosis not present

## 2020-02-06 DIAGNOSIS — M9902 Segmental and somatic dysfunction of thoracic region: Secondary | ICD-10-CM | POA: Diagnosis not present

## 2020-02-06 DIAGNOSIS — G47419 Narcolepsy without cataplexy: Secondary | ICD-10-CM | POA: Diagnosis not present

## 2020-02-15 DIAGNOSIS — M9905 Segmental and somatic dysfunction of pelvic region: Secondary | ICD-10-CM | POA: Diagnosis not present

## 2020-02-15 DIAGNOSIS — M791 Myalgia, unspecified site: Secondary | ICD-10-CM | POA: Diagnosis not present

## 2020-02-15 DIAGNOSIS — M9902 Segmental and somatic dysfunction of thoracic region: Secondary | ICD-10-CM | POA: Diagnosis not present

## 2020-02-15 DIAGNOSIS — M9903 Segmental and somatic dysfunction of lumbar region: Secondary | ICD-10-CM | POA: Diagnosis not present

## 2020-03-19 DIAGNOSIS — G47419 Narcolepsy without cataplexy: Secondary | ICD-10-CM | POA: Diagnosis not present

## 2020-03-19 DIAGNOSIS — M9902 Segmental and somatic dysfunction of thoracic region: Secondary | ICD-10-CM | POA: Diagnosis not present

## 2020-03-19 DIAGNOSIS — M9903 Segmental and somatic dysfunction of lumbar region: Secondary | ICD-10-CM | POA: Diagnosis not present

## 2020-03-19 DIAGNOSIS — M9905 Segmental and somatic dysfunction of pelvic region: Secondary | ICD-10-CM | POA: Diagnosis not present

## 2020-03-19 DIAGNOSIS — M791 Myalgia, unspecified site: Secondary | ICD-10-CM | POA: Diagnosis not present

## 2020-05-03 DIAGNOSIS — Z23 Encounter for immunization: Secondary | ICD-10-CM | POA: Diagnosis not present

## 2020-05-15 DIAGNOSIS — R059 Cough, unspecified: Secondary | ICD-10-CM | POA: Diagnosis not present

## 2020-06-13 DIAGNOSIS — Z630 Problems in relationship with spouse or partner: Secondary | ICD-10-CM | POA: Diagnosis not present

## 2020-06-13 DIAGNOSIS — M9903 Segmental and somatic dysfunction of lumbar region: Secondary | ICD-10-CM | POA: Diagnosis not present

## 2020-06-13 DIAGNOSIS — M9902 Segmental and somatic dysfunction of thoracic region: Secondary | ICD-10-CM | POA: Diagnosis not present

## 2020-06-13 DIAGNOSIS — M9905 Segmental and somatic dysfunction of pelvic region: Secondary | ICD-10-CM | POA: Diagnosis not present

## 2020-06-13 DIAGNOSIS — M791 Myalgia, unspecified site: Secondary | ICD-10-CM | POA: Diagnosis not present

## 2020-06-13 DIAGNOSIS — F4323 Adjustment disorder with mixed anxiety and depressed mood: Secondary | ICD-10-CM | POA: Diagnosis not present

## 2020-06-21 DIAGNOSIS — G47419 Narcolepsy without cataplexy: Secondary | ICD-10-CM | POA: Diagnosis not present

## 2020-06-21 DIAGNOSIS — M9902 Segmental and somatic dysfunction of thoracic region: Secondary | ICD-10-CM | POA: Diagnosis not present

## 2020-06-21 DIAGNOSIS — M9903 Segmental and somatic dysfunction of lumbar region: Secondary | ICD-10-CM | POA: Diagnosis not present

## 2020-06-21 DIAGNOSIS — M791 Myalgia, unspecified site: Secondary | ICD-10-CM | POA: Diagnosis not present

## 2020-06-21 DIAGNOSIS — M9905 Segmental and somatic dysfunction of pelvic region: Secondary | ICD-10-CM | POA: Diagnosis not present

## 2020-07-01 DIAGNOSIS — G4733 Obstructive sleep apnea (adult) (pediatric): Secondary | ICD-10-CM | POA: Diagnosis not present

## 2020-07-20 DIAGNOSIS — G4733 Obstructive sleep apnea (adult) (pediatric): Secondary | ICD-10-CM | POA: Diagnosis not present

## 2020-08-20 DIAGNOSIS — G4733 Obstructive sleep apnea (adult) (pediatric): Secondary | ICD-10-CM | POA: Diagnosis not present

## 2020-09-20 DIAGNOSIS — G4733 Obstructive sleep apnea (adult) (pediatric): Secondary | ICD-10-CM | POA: Diagnosis not present

## 2020-09-20 DIAGNOSIS — G47419 Narcolepsy without cataplexy: Secondary | ICD-10-CM | POA: Diagnosis not present

## 2020-10-02 DIAGNOSIS — M25562 Pain in left knee: Secondary | ICD-10-CM | POA: Diagnosis not present

## 2021-03-19 ENCOUNTER — Other Ambulatory Visit: Payer: Self-pay

## 2021-03-19 ENCOUNTER — Ambulatory Visit (HOSPITAL_COMMUNITY)
Admission: EM | Admit: 2021-03-19 | Discharge: 2021-03-19 | Disposition: A | Discharge status: Home / Self Care | Payer: Medicaid Other | Attending: Family Medicine | Admitting: Family Medicine

## 2021-03-19 ENCOUNTER — Encounter (HOSPITAL_COMMUNITY): Payer: Self-pay | Admitting: Emergency Medicine

## 2021-03-19 DIAGNOSIS — Z202 Contact with and (suspected) exposure to infections with a predominantly sexual mode of transmission: Secondary | ICD-10-CM | POA: Insufficient documentation

## 2021-03-19 HISTORY — DX: Essential (primary) hypertension: I10

## 2021-03-19 MED ORDER — METRONIDAZOLE 500 MG PO TABS: 2000.0000 mg | ORAL_TABLET | Freq: Once | ORAL | 0 refills | Status: AC

## 2021-03-19 NOTE — ED Provider Notes (Signed)
  [facility]   [id number] 03/19/21 Arrival Time: 1824  ASSESSMENT & PLAN:  1. Exposure to trichomonas    Meds ordered this encounter  Medications   metroNIDAZOLE (FLAGYL) 500 MG tablet    Sig: Take 4 tablets (2,000 mg total) by mouth once for 1 dose.    Dispense:  4 tablet    Refill:  0   Pending: Labs Reviewed  CYTOLOGY, (ORAL, ANAL, URETHRAL) ANCILLARY ONLY   Will notify of any positive results.  Reviewed expectations re: course of current medical issues. Questions answered. Outlined signs and symptoms indicating need for more acute intervention. Patient verbalized understanding. After Visit Summary given.   SUBJECTIVE:  Gregor Dershem is a 40 y.o. male who reports trich exposure; girlfriend +. No symptoms.  OBJECTIVE:  Vitals:   03/19/21 1921  BP: 138/84  Pulse: 92  Resp: 18  Temp: 98.4 F (36.9 C)  TempSrc: Oral  SpO2: 97%    General appearance: alert, cooperative, appears stated age and no distress GU: deferred Skin: warm and dry Psychological: alert and cooperative; normal mood and affect.  No results found for this or any previous visit.  Labs Reviewed  CYTOLOGY, (ORAL, ANAL, URETHRAL) ANCILLARY ONLY    No Known Allergies  Past Medical History:  Diagnosis Date   Hypertension    Family History  Problem Relation Age of Onset   Hypertension Mother    Cancer Father    Social History   Socioeconomic History   Marital status: Legally Separated    Spouse name: Not on file   Number of children: Not on file   Years of education: Not on file   Highest education level: Not on file  Occupational History   Not on file  Tobacco Use   Smoking status: Never   Smokeless tobacco: Never  Vaping Use   Vaping Use: Never used  Substance and Sexual Activity   Alcohol use: Never   Drug use: Yes    Types: Marijuana   Sexual activity: Yes  Other Topics Concern   Not on file  Social History Narrative   Not on file   Social Determinants  of Health   Financial Resource Strain: Not on file  Food Insecurity: Not on file  Transportation Needs: Not on file  Physical Activity: Not on file  Stress: Not on file  Social Connections: Not on file  Intimate Partner Violence: Not on file           Mardella Layman, MD 03/19/21 1949

## 2021-03-19 NOTE — ED Triage Notes (Signed)
Patient here for std evaluation.  Patient reports girlfriend has tested positive for trich.  Patient denies symptoms.

## 2021-03-20 LAB — CYTOLOGY, (ORAL, ANAL, URETHRAL) ANCILLARY ONLY
Chlamydia: NEGATIVE
Comment: NEGATIVE
Comment: NEGATIVE
Comment: NORMAL
Neisseria Gonorrhea: NEGATIVE
Trichomonas: NEGATIVE
